# Patient Record
Sex: Male | Born: 1960
Health system: Southern US, Community
[De-identification: ages and names within clinical notes are randomized; demographics above are authoritative.]

## PROBLEM LIST (undated history)

## (undated) DIAGNOSIS — I10 Essential (primary) hypertension: Secondary | ICD-10-CM

## (undated) HISTORY — PX: CHOLECYSTECTOMY: SHX55

## (undated) HISTORY — PX: BACK SURGERY: SHX140

## (undated) HISTORY — PX: GLAUCOMA REPAIR: SHX214

---

## 1998-03-29 ENCOUNTER — Inpatient Hospital Stay (HOSPITAL_COMMUNITY): Admission: RE | Admit: 1998-03-29 | Discharge: 1998-04-02 | Payer: Self-pay | Admitting: Neurosurgery

## 1999-12-06 ENCOUNTER — Encounter: Admission: RE | Admit: 1999-12-06 | Discharge: 1999-12-06 | Payer: Self-pay | Admitting: Neurosurgery

## 1999-12-06 ENCOUNTER — Encounter: Payer: Self-pay | Admitting: Neurosurgery

## 2003-04-12 ENCOUNTER — Ambulatory Visit (HOSPITAL_COMMUNITY): Admission: RE | Admit: 2003-04-12 | Discharge: 2003-04-12 | Payer: Self-pay | Admitting: General Surgery

## 2008-12-06 ENCOUNTER — Ambulatory Visit (HOSPITAL_COMMUNITY): Admission: RE | Admit: 2008-12-06 | Discharge: 2008-12-06 | Payer: Self-pay | Admitting: Specialist

## 2011-03-22 NOTE — H&P (Signed)
   Donald Walker, Donald Walker                             ACCOUNT NO.:  [id number]   MEDICAL RECORD NO.:  [id number]                  PATIENT TYPE:   LOCATION:                                       FACILITY:   PHYSICIAN:  Dalia Heading, M.D.               DATE OF BIRTH:  [birth year]   DATE OF ADMISSION:  DATE OF DISCHARGE:                                HISTORY & PHYSICAL   CHIEF COMPLAINT:  History of adenomatous colon polyp.   HISTORY OF PRESENT ILLNESS:  The patient is a 50 year old white male who is  referred for a follow up colonoscopy.  He had an adenomatous polyp removed 3  years ago at another facility. He is not referred for followup colonoscopy.  He denies any lightheadedness, weight loss, fever, abdominal pain,  constipation, diarrhea, hematochezia, or melena.  He denies any hemorrhoidal  problems.  There is no immediate family history of colon carcinoma.   PAST MEDICAL HISTORY:  Past medical history includes back problems and eye  problems.   PAST SURGICAL HISTORY:  Back surgery.   CURRENT MEDICATIONS:  1. Alphagan.  2. Eye drops.  3. Xalatan.  4. Pilocarpine.   ALLERGIES:  No known drug allergies.   REVIEW OF SYSTEMS:  Noncontributory.   PHYSICAL EXAMINATION:  GENERAL:  On physical examination, the patient is a  well-developed well-nourished white male in no acute distress.  VITAL SIGNS:  He is afebrile and vital signs are stable.  LUNGS:  Clear to auscultation with equal breath sounds bilaterally.  HEART:  Heart examination reveals a regular rate and rhythm without S3, S4,  or murmurs.  ABDOMEN:  The abdomen is soft, nontender, nondistended.  No  hepatosplenomegaly or masses are noted.  RECTAL:  Examination was deferred to the procedure.   IMPRESSION:  History of adenomatous colon polyp.    PLAN:  The patient is scheduled for a colonoscopy on 04/12/2003.  The risks  and benefits of the procedure including bleeding and perforation were fully  explained to the  patient who gave informed consent.                                               Dalia Heading, M.D.    MAJ/MEDQ  D:  03/24/2003  T:  03/24/2003  Job:  [id number]   cc:   Donzetta Sprung  [address]  [place]  [place] [address]  Fax: [phone]

## 2013-08-04 ENCOUNTER — Encounter (INDEPENDENT_AMBULATORY_CARE_PROVIDER_SITE_OTHER): Payer: Self-pay | Admitting: *Deleted

## 2013-08-26 ENCOUNTER — Ambulatory Visit (INDEPENDENT_AMBULATORY_CARE_PROVIDER_SITE_OTHER): Payer: 59 | Admitting: Internal Medicine

## 2013-08-26 ENCOUNTER — Encounter (INDEPENDENT_AMBULATORY_CARE_PROVIDER_SITE_OTHER): Payer: Self-pay | Admitting: Internal Medicine

## 2013-08-26 ENCOUNTER — Telehealth (INDEPENDENT_AMBULATORY_CARE_PROVIDER_SITE_OTHER): Payer: Self-pay | Admitting: *Deleted

## 2013-08-26 ENCOUNTER — Other Ambulatory Visit (INDEPENDENT_AMBULATORY_CARE_PROVIDER_SITE_OTHER): Payer: Self-pay | Admitting: *Deleted

## 2013-08-26 VITALS — BP 118/80 | HR 72 | Temp 98.6°F | Ht 67.5 in | Wt 181.7 lb

## 2013-08-26 DIAGNOSIS — Z800 Family history of malignant neoplasm of digestive organs: Secondary | ICD-10-CM | POA: Insufficient documentation

## 2013-08-26 DIAGNOSIS — Z1211 Encounter for screening for malignant neoplasm of colon: Secondary | ICD-10-CM

## 2013-08-26 DIAGNOSIS — K625 Hemorrhage of anus and rectum: Secondary | ICD-10-CM | POA: Insufficient documentation

## 2013-08-26 DIAGNOSIS — Z860100 Personal history of colon polyps, unspecified: Secondary | ICD-10-CM | POA: Insufficient documentation

## 2013-08-26 NOTE — Progress Notes (Signed)
Subjective:     Patient ID: Donald Walker, male   DOB: [birth year], 52 y.o.   MRN: [id number]  HPI Referred to our office by Dr. Rosann Auerbach for internal hemorrhoids. He tells me that everytime he has a BM he will see blood. This has been occurring for 5 yrs.  He actually saw Dr Samuella Cota in [place] for this same complaint. He has not seen GI since 2011. Hx of colon polyps in 2004. (I will try to get those records)  He will see blood on the tissue. His BMs are not hard. He usually has a BM 2-3 times a day and are normal. No melena.He does have some discomfort when he has a BM in his rectum. Feels like needles. His appetite is good. No weight loss.  Takes Aleve  because of his braces which is infrequently Last colonoscopy in 2011 which he reports was normal. Family hx of colon cancer: paternal grandmother age in her 47s. Review of Systems Current Outpatient Prescriptions  Medication Sig Dispense Refill  . naproxen sodium (ANAPROX) 220 MG tablet Take 220 mg by mouth 2 (two) times daily with a meal.       No current facility-administered medications for this visit.   History reviewed. No pertinent past medical history. Past Surgical History  Procedure Laterality Date  . Cholecystectomy  2004 Dr. Gabriel Cirri    rt upper quadrant pain, gallstones  . Back surgery      1999  . Glaucoma repair     No Known Allergies     Objective:   Physical Exam  Filed Vitals:   08/26/13 1507  BP: 118/80  Pulse: 72  Temp: 98.6 F (37 C)  Height: 5' 7.5" (1.715 m)  Weight: 181 lb 11.2 oz (82.419 kg)  Alert and oriented. Skin warm and dry. Oral mucosa is moist.   . Sclera anicteric, conjunctivae is pink. Thyroid not enlarged. No cervical lymphadenopathy. Lungs clear. Heart regular rate and rhythm.  Abdomen is soft. Bowel sounds are positive. No hepatomegaly. No abdominal masses felt. No tenderness.  No edema to lower extremities. Patient is alert and oriented.     Stool brown and guaiac  negative. Assessment:    Rectal bleeding. Possible hemorrhoidal. Small amt of blood when he wipes. Hx colon polyps an family hx of colon cancer in a paternal grandmother. Colonic neoplasm needs to be ruled out. Colonic polyps is also in the differential.     Plan:    Colonoscopy with Dr. Karilyn Cota.The risks and benefits such as perforation, bleeding, and infection were reviewed with the patient and is agreeable. Apothecary Hemorrhoidal cream Rx given to patient to use 4 times a day as needed.   I will try to locate his colonoscopy reports.

## 2013-08-26 NOTE — Patient Instructions (Signed)
Colonoscopy with Dr. Rehman. The risks and benefits such as perforation, bleeding, and infection were reviewed with the patient and is agreeable. 

## 2013-08-26 NOTE — Telephone Encounter (Signed)
Patient needs movi prep 

## 2013-08-27 MED ORDER — PEG-KCL-NACL-NASULF-NA ASC-C 100 G PO SOLR: 1.0000 | Freq: Once | ORAL | Status: AC

## 2013-09-14 ENCOUNTER — Encounter (INDEPENDENT_AMBULATORY_CARE_PROVIDER_SITE_OTHER): Payer: Self-pay

## 2013-10-14 ENCOUNTER — Encounter (INDEPENDENT_AMBULATORY_CARE_PROVIDER_SITE_OTHER): Payer: Self-pay

## 2013-11-18 ENCOUNTER — Encounter (HOSPITAL_COMMUNITY): Payer: Self-pay | Admitting: Pharmacy Technician

## 2013-12-02 ENCOUNTER — Encounter (HOSPITAL_COMMUNITY): Admission: RE | Payer: Self-pay | Source: Ambulatory Visit

## 2013-12-02 ENCOUNTER — Ambulatory Visit (HOSPITAL_COMMUNITY): Admission: RE | Admit: 2013-12-02 | Payer: 59 | Source: Ambulatory Visit | Admitting: Internal Medicine

## 2013-12-02 SURGERY — COLONOSCOPY
Anesthesia: Moderate Sedation

## 2016-01-23 ENCOUNTER — Other Ambulatory Visit: Payer: Self-pay | Admitting: Gastroenterology

## 2016-01-23 DIAGNOSIS — R1084 Generalized abdominal pain: Secondary | ICD-10-CM

## 2016-01-29 ENCOUNTER — Ambulatory Visit
Admission: RE | Admit: 2016-01-29 | Discharge: 2016-01-29 | Disposition: A | Discharge status: Home / Self Care | Payer: 59 | Source: Ambulatory Visit | Attending: Gastroenterology | Admitting: Gastroenterology

## 2016-01-29 DIAGNOSIS — R1084 Generalized abdominal pain: Secondary | ICD-10-CM

## 2016-01-29 MED ORDER — IOPAMIDOL (ISOVUE-300) INJECTION 61%
100.0000 mL | Freq: Once | INTRAVENOUS | Status: AC | PRN
  Administered 2016-01-29: 100 mL via INTRAVENOUS

## 2016-08-23 LAB — BASIC METABOLIC PANEL: GLUCOSE: 122 mg/dL

## 2016-12-13 DIAGNOSIS — J019 Acute sinusitis, unspecified: Secondary | ICD-10-CM | POA: Diagnosis not present

## 2016-12-13 DIAGNOSIS — R1011 Right upper quadrant pain: Secondary | ICD-10-CM | POA: Diagnosis not present

## 2016-12-20 DIAGNOSIS — R932 Abnormal findings on diagnostic imaging of liver and biliary tract: Secondary | ICD-10-CM | POA: Diagnosis not present

## 2016-12-20 DIAGNOSIS — Z9049 Acquired absence of other specified parts of digestive tract: Secondary | ICD-10-CM | POA: Diagnosis not present

## 2016-12-20 DIAGNOSIS — R109 Unspecified abdominal pain: Secondary | ICD-10-CM | POA: Diagnosis not present

## 2016-12-20 DIAGNOSIS — R1011 Right upper quadrant pain: Secondary | ICD-10-CM | POA: Diagnosis not present

## 2017-01-27 DIAGNOSIS — J019 Acute sinusitis, unspecified: Secondary | ICD-10-CM | POA: Diagnosis not present

## 2017-03-28 DIAGNOSIS — S86811A Strain of other muscle(s) and tendon(s) at lower leg level, right leg, initial encounter: Secondary | ICD-10-CM | POA: Diagnosis not present

## 2017-04-08 DIAGNOSIS — M47812 Spondylosis without myelopathy or radiculopathy, cervical region: Secondary | ICD-10-CM | POA: Diagnosis not present

## 2017-04-08 DIAGNOSIS — R001 Bradycardia, unspecified: Secondary | ICD-10-CM | POA: Diagnosis not present

## 2017-04-08 DIAGNOSIS — I10 Essential (primary) hypertension: Secondary | ICD-10-CM | POA: Diagnosis not present

## 2017-04-08 DIAGNOSIS — M531 Cervicobrachial syndrome: Secondary | ICD-10-CM | POA: Diagnosis not present

## 2017-04-14 DIAGNOSIS — M47812 Spondylosis without myelopathy or radiculopathy, cervical region: Secondary | ICD-10-CM | POA: Diagnosis not present

## 2017-04-14 DIAGNOSIS — M531 Cervicobrachial syndrome: Secondary | ICD-10-CM | POA: Diagnosis not present

## 2017-04-17 DIAGNOSIS — M531 Cervicobrachial syndrome: Secondary | ICD-10-CM | POA: Diagnosis not present

## 2017-04-17 DIAGNOSIS — M47812 Spondylosis without myelopathy or radiculopathy, cervical region: Secondary | ICD-10-CM | POA: Diagnosis not present

## 2017-04-21 DIAGNOSIS — M47812 Spondylosis without myelopathy or radiculopathy, cervical region: Secondary | ICD-10-CM | POA: Diagnosis not present

## 2017-04-21 DIAGNOSIS — M531 Cervicobrachial syndrome: Secondary | ICD-10-CM | POA: Diagnosis not present

## 2017-05-12 ENCOUNTER — Other Ambulatory Visit: Payer: Self-pay | Admitting: Cardiology

## 2017-05-12 ENCOUNTER — Ambulatory Visit
Admission: RE | Admit: 2017-05-12 | Discharge: 2017-05-12 | Disposition: A | Discharge status: Home / Self Care | Payer: 59 | Source: Ambulatory Visit | Attending: Cardiology | Admitting: Cardiology

## 2017-05-12 DIAGNOSIS — I10 Essential (primary) hypertension: Secondary | ICD-10-CM | POA: Diagnosis not present

## 2017-05-12 DIAGNOSIS — R079 Chest pain, unspecified: Secondary | ICD-10-CM

## 2017-05-12 DIAGNOSIS — Z0189 Encounter for other specified special examinations: Secondary | ICD-10-CM | POA: Diagnosis not present

## 2017-05-12 DIAGNOSIS — H5203 Hypermetropia, bilateral: Secondary | ICD-10-CM | POA: Diagnosis not present

## 2017-05-12 DIAGNOSIS — R0789 Other chest pain: Secondary | ICD-10-CM | POA: Diagnosis not present

## 2017-05-14 DIAGNOSIS — I10 Essential (primary) hypertension: Secondary | ICD-10-CM | POA: Diagnosis not present

## 2017-05-14 DIAGNOSIS — R5383 Other fatigue: Secondary | ICD-10-CM | POA: Diagnosis not present

## 2017-05-20 DIAGNOSIS — I10 Essential (primary) hypertension: Secondary | ICD-10-CM | POA: Diagnosis not present

## 2017-05-20 DIAGNOSIS — R0789 Other chest pain: Secondary | ICD-10-CM | POA: Diagnosis not present

## 2017-06-04 DIAGNOSIS — I10 Essential (primary) hypertension: Secondary | ICD-10-CM | POA: Diagnosis not present

## 2017-06-04 DIAGNOSIS — R5381 Other malaise: Secondary | ICD-10-CM | POA: Diagnosis not present

## 2017-06-04 DIAGNOSIS — R0789 Other chest pain: Secondary | ICD-10-CM | POA: Diagnosis not present

## 2017-07-17 ENCOUNTER — Other Ambulatory Visit (HOSPITAL_BASED_OUTPATIENT_CLINIC_OR_DEPARTMENT_OTHER): Payer: Self-pay

## 2017-07-17 DIAGNOSIS — R5383 Other fatigue: Secondary | ICD-10-CM

## 2017-07-17 DIAGNOSIS — G4730 Sleep apnea, unspecified: Secondary | ICD-10-CM

## 2017-07-23 ENCOUNTER — Ambulatory Visit (HOSPITAL_BASED_OUTPATIENT_CLINIC_OR_DEPARTMENT_OTHER): Payer: 59 | Attending: Family Medicine | Admitting: Internal Medicine

## 2017-07-23 VITALS — Ht 67.0 in | Wt 182.0 lb

## 2017-07-23 DIAGNOSIS — G4730 Sleep apnea, unspecified: Secondary | ICD-10-CM

## 2017-07-23 DIAGNOSIS — R5383 Other fatigue: Secondary | ICD-10-CM

## 2017-07-23 DIAGNOSIS — G4733 Obstructive sleep apnea (adult) (pediatric): Secondary | ICD-10-CM | POA: Insufficient documentation

## 2017-07-23 DIAGNOSIS — G4761 Periodic limb movement disorder: Secondary | ICD-10-CM | POA: Diagnosis not present

## 2017-07-26 DIAGNOSIS — G4730 Sleep apnea, unspecified: Secondary | ICD-10-CM

## 2017-07-26 NOTE — Procedures (Signed)
Patient Name: Donald Walker, Donald Walker Date: 07/23/2017 Gender: Male D.O.B: 1960-11-20 Age (years): 56 Referring Provider: Caryl Bis Height (inches): 54 Interpreting Physician: Baird Lyons MD, ABSM Weight (lbs): 182 RPSGT: Carolin Coy BMI: 29 MRN: [id number] Neck Size: 16.00 CLINICAL INFORMATION The patient is referred for a split night study with BPAP. MEDICATIONS Medications self-administered by patient taken the night of the study : none reported  SLEEP STUDY TECHNIQUE As per the AASM Manual for the Scoring of Sleep and Associated Events v2.3 (April 2016) with a hypopnea requiring 4% desaturations.  The channels recorded and monitored were frontal, central and occipital EEG, electrooculogram (EOG), submentalis EMG (chin), nasal and oral airflow, thoracic and abdominal wall motion, anterior tibialis EMG, snore microphone, electrocardiogram, and pulse oximetry. Bi-level positive airway pressure (BiPAP) was initiated when the patient met split night criteria and was titrated according to treat sleep-disordered breathing.  RESPIRATORY PARAMETERS Diagnostic  Total AHI (/hr): 28.9 RDI (/hr): 28.9 OA Index (/hr): 2.4 CA Index (/hr): 0.4 REM AHI (/hr): N/A NREM AHI (/hr): 28.9 Supine AHI (/hr): 31.7 Non-supine AHI (/hr): 11.60 Min O2 Sat (%): 82.00 Mean O2 (%): 91.81 Time below 88% (min): 3.1   Titration  Optimal IPAP Pressure (cm): 18 Optimal EPAP Pressure (cm): 14 AHI at Optimal Pressure (/hr): 0.0 Min O2 at Optimal Pressure (%): 94.0 Sleep % at Optimal (%): 80 Supine % at Optimal (%): 0      SLEEP ARCHITECTURE The study was initiated at 10:07:46 PM and terminated at 5:08:44 AM. The total recorded time was 421.0 minutes. EEG confirmed total sleep time was 322.5 minutes yielding a sleep efficiency of 76.6%. Sleep onset after lights out was 15.2 minutes with a REM latency of 290.0 minutes. The patient spent 13.95% of the night in stage N1 sleep, 78.60% in stage N2 sleep,  0.00% in stage N3 and 7.44% in REM. Wake after sleep onset (WASO) was 83.3 minutes. The Arousal Index was 9.1/hour.  LEG MOVEMENT DATA The total Periodic Limb Movements of Sleep (PLMS) were 145. The PLMS index was 26.98 .  CARDIAC DATA The 2 lead EKG demonstrated sinus rhythm. The mean heart rate was 50.92 beats per minute. Other EKG findings include: None.  IMPRESSIONS - Moderate obstructive sleep apnea occurred during the diagnostic portion of the study (AHI = 28.9 /hour). - CPAP provided inadequate control at comfortable pressure and was changed to BIPAP. - An optimal BIPAP pressure was selected for this patient ( 18 / 14 cm of water) - Mild central sleep apnea occurred during the diagnostic portion of the study (CAI = 0.4/hour). - Mild oxygen desaturation was noted during the diagnostic portion of the study (Min O2 = 82.00%). - The patient snored with Moderate snoring volume during the diagnostic portion of the study. - No cardiac abnormalities were noted during this study. - Severe periodic limb movements of sleep occurred during the study.  DIAGNOSIS - Obstructive Sleep Apnea (327.23 [G47.33 ICD-10]) - Periodic Limb Movement Syndrome  RECOMMENDATIONS - Trial of BiPAP therapy on 18/14 cm H2O with a Medium size [employer] Respironics Nasal Pillow Mask Nuance Pro Gel mask and heated humidification. - Be careful with alcohol, sedatives and other CNS depressants that may worsen sleep apnea and disrupt normal sleep architecture. - Sleep hygiene should be reviewed to assess factors that may improve sleep quality. - Weight management and regular exercise should be initiated or continued. - If limb movement sleep disturbance continues after patient is established with BIPAP, then consider trial of specific therapy  such as Requip or Mirapex.  [Electronically signed] 07/26/2017 12:26 PM  Baird Lyons MD, [place], American Board of Sleep Medicine   NPI: [id number]  [place], American Board of Sleep Medicine  ELECTRONICALLY SIGNED ON:  07/26/2017, 12:22 PM [place] PH: [phone]   FX: [phone] [employer]

## 2017-08-13 DIAGNOSIS — G4733 Obstructive sleep apnea (adult) (pediatric): Secondary | ICD-10-CM | POA: Diagnosis not present

## 2017-08-25 ENCOUNTER — Encounter (HOSPITAL_BASED_OUTPATIENT_CLINIC_OR_DEPARTMENT_OTHER): Payer: 59

## 2017-08-27 DIAGNOSIS — Z0001 Encounter for general adult medical examination with abnormal findings: Secondary | ICD-10-CM | POA: Diagnosis not present

## 2017-09-02 DIAGNOSIS — Z0001 Encounter for general adult medical examination with abnormal findings: Secondary | ICD-10-CM | POA: Diagnosis not present

## 2017-09-13 DIAGNOSIS — G4733 Obstructive sleep apnea (adult) (pediatric): Secondary | ICD-10-CM | POA: Diagnosis not present

## 2017-10-13 DIAGNOSIS — G4733 Obstructive sleep apnea (adult) (pediatric): Secondary | ICD-10-CM | POA: Diagnosis not present

## 2017-11-13 DIAGNOSIS — G4733 Obstructive sleep apnea (adult) (pediatric): Secondary | ICD-10-CM | POA: Diagnosis not present

## 2017-12-14 DIAGNOSIS — G4733 Obstructive sleep apnea (adult) (pediatric): Secondary | ICD-10-CM | POA: Diagnosis not present

## 2017-12-25 DIAGNOSIS — J209 Acute bronchitis, unspecified: Secondary | ICD-10-CM | POA: Diagnosis not present

## 2018-01-11 DIAGNOSIS — G4733 Obstructive sleep apnea (adult) (pediatric): Secondary | ICD-10-CM | POA: Diagnosis not present

## 2018-02-11 DIAGNOSIS — G4733 Obstructive sleep apnea (adult) (pediatric): Secondary | ICD-10-CM | POA: Diagnosis not present

## 2018-03-02 DIAGNOSIS — E782 Mixed hyperlipidemia: Secondary | ICD-10-CM | POA: Diagnosis not present

## 2018-03-02 DIAGNOSIS — N183 Chronic kidney disease, stage 3 (moderate): Secondary | ICD-10-CM | POA: Diagnosis not present

## 2018-03-02 DIAGNOSIS — L2084 Intrinsic (allergic) eczema: Secondary | ICD-10-CM | POA: Diagnosis not present

## 2018-03-13 DIAGNOSIS — G4733 Obstructive sleep apnea (adult) (pediatric): Secondary | ICD-10-CM | POA: Diagnosis not present

## 2018-03-19 ENCOUNTER — Ambulatory Visit: Payer: 59 | Admitting: General Surgery

## 2018-03-26 ENCOUNTER — Ambulatory Visit: Payer: 59 | Admitting: General Surgery

## 2018-03-26 ENCOUNTER — Encounter: Payer: Self-pay | Admitting: General Surgery

## 2018-03-26 ENCOUNTER — Encounter (INDEPENDENT_AMBULATORY_CARE_PROVIDER_SITE_OTHER): Payer: Self-pay

## 2018-03-26 VITALS — BP 161/88 | HR 53 | Temp 99.1°F | Resp 18 | Wt 193.0 lb

## 2018-03-26 DIAGNOSIS — K602 Anal fissure, unspecified: Secondary | ICD-10-CM

## 2018-03-26 MED ORDER — HYDROCORTISONE 1 % EX CREA: TOPICAL_CREAM | CUTANEOUS | 1 refills | Status: AC

## 2018-03-26 NOTE — Progress Notes (Signed)
Donald Walker; [id number]; [birth year]   HPI Patient is a 57 year old white male who was referred to my care by Dr. Quillian Quince for evaluation and treatment of an anal fissure.  Patient states that he has had intermittent rectal discomfort bleeding to his coccyx and blood on the toilet paper when he wipes himself for the past year.  It is sporadic in nature.  He denies any constipation or straining when he moves his bowels.  He sometimes moves his bowels several times a day.  He has tried a cream regimen several months ago which did resolve his symptoms temporarily.  He last had an episode of what on the toilet paper earlier this week.  He has not had any rectal surgery in the past.  He currently has 0 out of 10 pain. History reviewed. No pertinent past medical history.  Past Surgical History:  Procedure Laterality Date  . BACK SURGERY     1999  . CHOLECYSTECTOMY  2004 Dr. Anthony Sar   rt upper quadrant pain, gallstones  . GLAUCOMA REPAIR      History reviewed. No pertinent family history.  Current Outpatient Medications on File Prior to Visit  Medication Sig Dispense Refill  . amLODipine-valsartan (EXFORGE) 10-160 MG tablet Take 1 tablet by mouth daily.    . Escitalopram Oxalate (LEXAPRO PO) Take by mouth.    . montelukast (SINGULAIR) 10 MG tablet Take 10 mg by mouth at bedtime.    . naproxen sodium (ANAPROX) 220 MG tablet Take 220 mg by mouth 2 (two) times daily with a meal.    . peg 3350 powder (MOVIPREP) 100 G SOLR Take 1 kit (200 g total) by mouth once. (Patient not taking: Reported on 03/26/2018) 1 kit 0   No current facility-administered medications on file prior to visit.     No Known Allergies  Social History   Substance and Sexual Activity  Alcohol Use Yes   Comment: occasionally    Social History   Tobacco Use  Smoking Status Never Smoker  Smokeless Tobacco Never Used    Review of Systems  Constitutional: Positive for malaise/fatigue.  HENT: Positive for sinus pain.    Eyes: Negative.   Respiratory: Negative.   Cardiovascular: Negative.   Gastrointestinal: Negative.   Genitourinary: Negative.   Musculoskeletal: Positive for joint pain and neck pain.  Skin: Positive for rash.  Endo/Heme/Allergies: Negative.   Psychiatric/Behavioral: Negative.     Objective   Vitals:   03/26/18 0918  BP: (!) 161/88  Pulse: (!) 53  Resp: 18  Temp: 99.1 F (37.3 C)    Physical Exam  Constitutional: He appears well-developed and well-nourished.  HENT:  Head: Normocephalic and atraumatic.  Cardiovascular: Normal rate, regular rhythm and normal heart sounds. Exam reveals no gallop and no friction rub.  No murmur heard. Pulmonary/Chest: Effort normal and breath sounds normal. No stridor. No respiratory distress. He has no wheezes. He has no rales.  Genitourinary:  Genitourinary Comments: Very small posterior anal fissure with sentinel tag present.  Sphincter tone is somewhat tight, but does relax.  No strictures noted.  No external hemorrhoidal tissue present.  No mass in the rectal vault.  No blood present.  Vitals reviewed.   Assessment  Anal fissure Plan   I told the patient that I would reserve surgical intervention once his symptoms are not tolerable.  We will try a course of hydrocortisone cream as needed when he has a flareup.  He understands this and agrees.  Literature was given.  Follow-up  as needed.

## 2018-03-26 NOTE — Patient Instructions (Signed)
Anal Fissure, Adult An anal fissure is a small tear or crack in the skin around the anus. Bleeding from a fissure usually stops on its own within a few minutes. However, bleeding will often occur again with each bowel movement until the crack heals. What are the causes? This condition may be caused by:  Passing large, hard stool (feces).  Frequent diarrhea.  Constipation.  Inflammatory bowel disease (Crohn disease or ulcerative colitis).  Infections.  Anal sex.  What are the signs or symptoms? Symptoms of this condition include:  Bleeding from the rectum.  Small amounts of blood seen on your stool, on toilet paper, or in the toilet after a bowel movement.  Painful bowel movements.  Itching or irritation around the anus.  How is this diagnosed? A health care provider may diagnose this condition by closely examining the anal area. An anal fissure can usually be seen with careful inspection. In some cases, a rectal exam may be performed, or a short tube (anoscope) may be used to examine the anal canal. How is this treated? Treatment for this condition may include:  Taking steps to avoid constipation. This may include making changes to your diet, such as increasing your intake of fiber or fluid.  Taking fiber supplements. These supplements can soften your stool to help make bowel movements easier. Your health care provider may also prescribe a stool softener if your stool is often hard.  Taking sitz baths. This may help to heal the tear.  Using medicated creams or ointments. These may be prescribed to lessen discomfort.  Follow these instructions at home: Eating and drinking  Avoid foods that may be constipating, such as bananas and dairy products.  Drink enough fluid to keep your urine clear or pale yellow.  Maintain a diet that is high in fruits, whole grains, and vegetables. General instructions  Keep the anal area as clean and dry as possible.  Take sitz baths as  told by your health care provider. Do not use soap in the sitz baths.  Take over-the-counter and prescription medicines only as told by your health care provider.  Use creams or ointments only as told by your health care provider.  Keep all follow-up visits as told by your health care provider. This is important. Contact a health care provider if:  You have more bleeding.  You have a fever.  You have diarrhea that is mixed with blood.  You continue to have pain.  Your problem is getting worse rather than better. This information is not intended to replace advice given to you by your health care provider. Make sure you discuss any questions you have with your health care provider. Document Released: 10/21/2005 Document Revised: 02/28/2016 Document Reviewed: 01/16/2015 Elsevier Interactive Patient Education  2018 Elsevier Inc.  

## 2018-04-13 DIAGNOSIS — G4733 Obstructive sleep apnea (adult) (pediatric): Secondary | ICD-10-CM | POA: Diagnosis not present

## 2018-04-24 DIAGNOSIS — T1501XA Foreign body in cornea, right eye, initial encounter: Secondary | ICD-10-CM | POA: Diagnosis not present

## 2018-04-27 DIAGNOSIS — T1501XA Foreign body in cornea, right eye, initial encounter: Secondary | ICD-10-CM | POA: Diagnosis not present

## 2018-05-11 DIAGNOSIS — S30861A Insect bite (nonvenomous) of abdominal wall, initial encounter: Secondary | ICD-10-CM | POA: Diagnosis not present

## 2018-05-11 DIAGNOSIS — H109 Unspecified conjunctivitis: Secondary | ICD-10-CM | POA: Diagnosis not present

## 2018-05-13 DIAGNOSIS — G4733 Obstructive sleep apnea (adult) (pediatric): Secondary | ICD-10-CM | POA: Diagnosis not present

## 2018-06-13 DIAGNOSIS — G4733 Obstructive sleep apnea (adult) (pediatric): Secondary | ICD-10-CM | POA: Diagnosis not present

## 2018-07-14 DIAGNOSIS — G4733 Obstructive sleep apnea (adult) (pediatric): Secondary | ICD-10-CM | POA: Diagnosis not present

## 2018-08-13 DIAGNOSIS — G4733 Obstructive sleep apnea (adult) (pediatric): Secondary | ICD-10-CM | POA: Diagnosis not present

## 2018-09-02 DIAGNOSIS — Z0001 Encounter for general adult medical examination with abnormal findings: Secondary | ICD-10-CM | POA: Diagnosis not present

## 2018-09-07 DIAGNOSIS — Z0001 Encounter for general adult medical examination with abnormal findings: Secondary | ICD-10-CM | POA: Diagnosis not present

## 2018-09-07 DIAGNOSIS — E782 Mixed hyperlipidemia: Secondary | ICD-10-CM | POA: Diagnosis not present

## 2018-09-07 DIAGNOSIS — L2084 Intrinsic (allergic) eczema: Secondary | ICD-10-CM | POA: Diagnosis not present

## 2018-09-07 DIAGNOSIS — N183 Chronic kidney disease, stage 3 (moderate): Secondary | ICD-10-CM | POA: Diagnosis not present

## 2018-09-07 DIAGNOSIS — Z1212 Encounter for screening for malignant neoplasm of rectum: Secondary | ICD-10-CM | POA: Diagnosis not present

## 2018-09-09 DIAGNOSIS — G4733 Obstructive sleep apnea (adult) (pediatric): Secondary | ICD-10-CM | POA: Diagnosis not present

## 2018-09-13 DIAGNOSIS — G4733 Obstructive sleep apnea (adult) (pediatric): Secondary | ICD-10-CM | POA: Diagnosis not present

## 2018-10-05 DIAGNOSIS — D229 Melanocytic nevi, unspecified: Secondary | ICD-10-CM | POA: Diagnosis not present

## 2019-02-04 IMAGING — DX DG CHEST 2V
2 series · 2 of 2 positions shown · non-contrast
Comparison: Report of plain films the chest and CT chest 10/27/2015
reviewed. Images are not available.

CLINICAL DATA: Chest pain for 2 weeks.  No known injury.

EXAM:
CHEST  2 VIEW

[dg chest 2 view (1 of 2)]
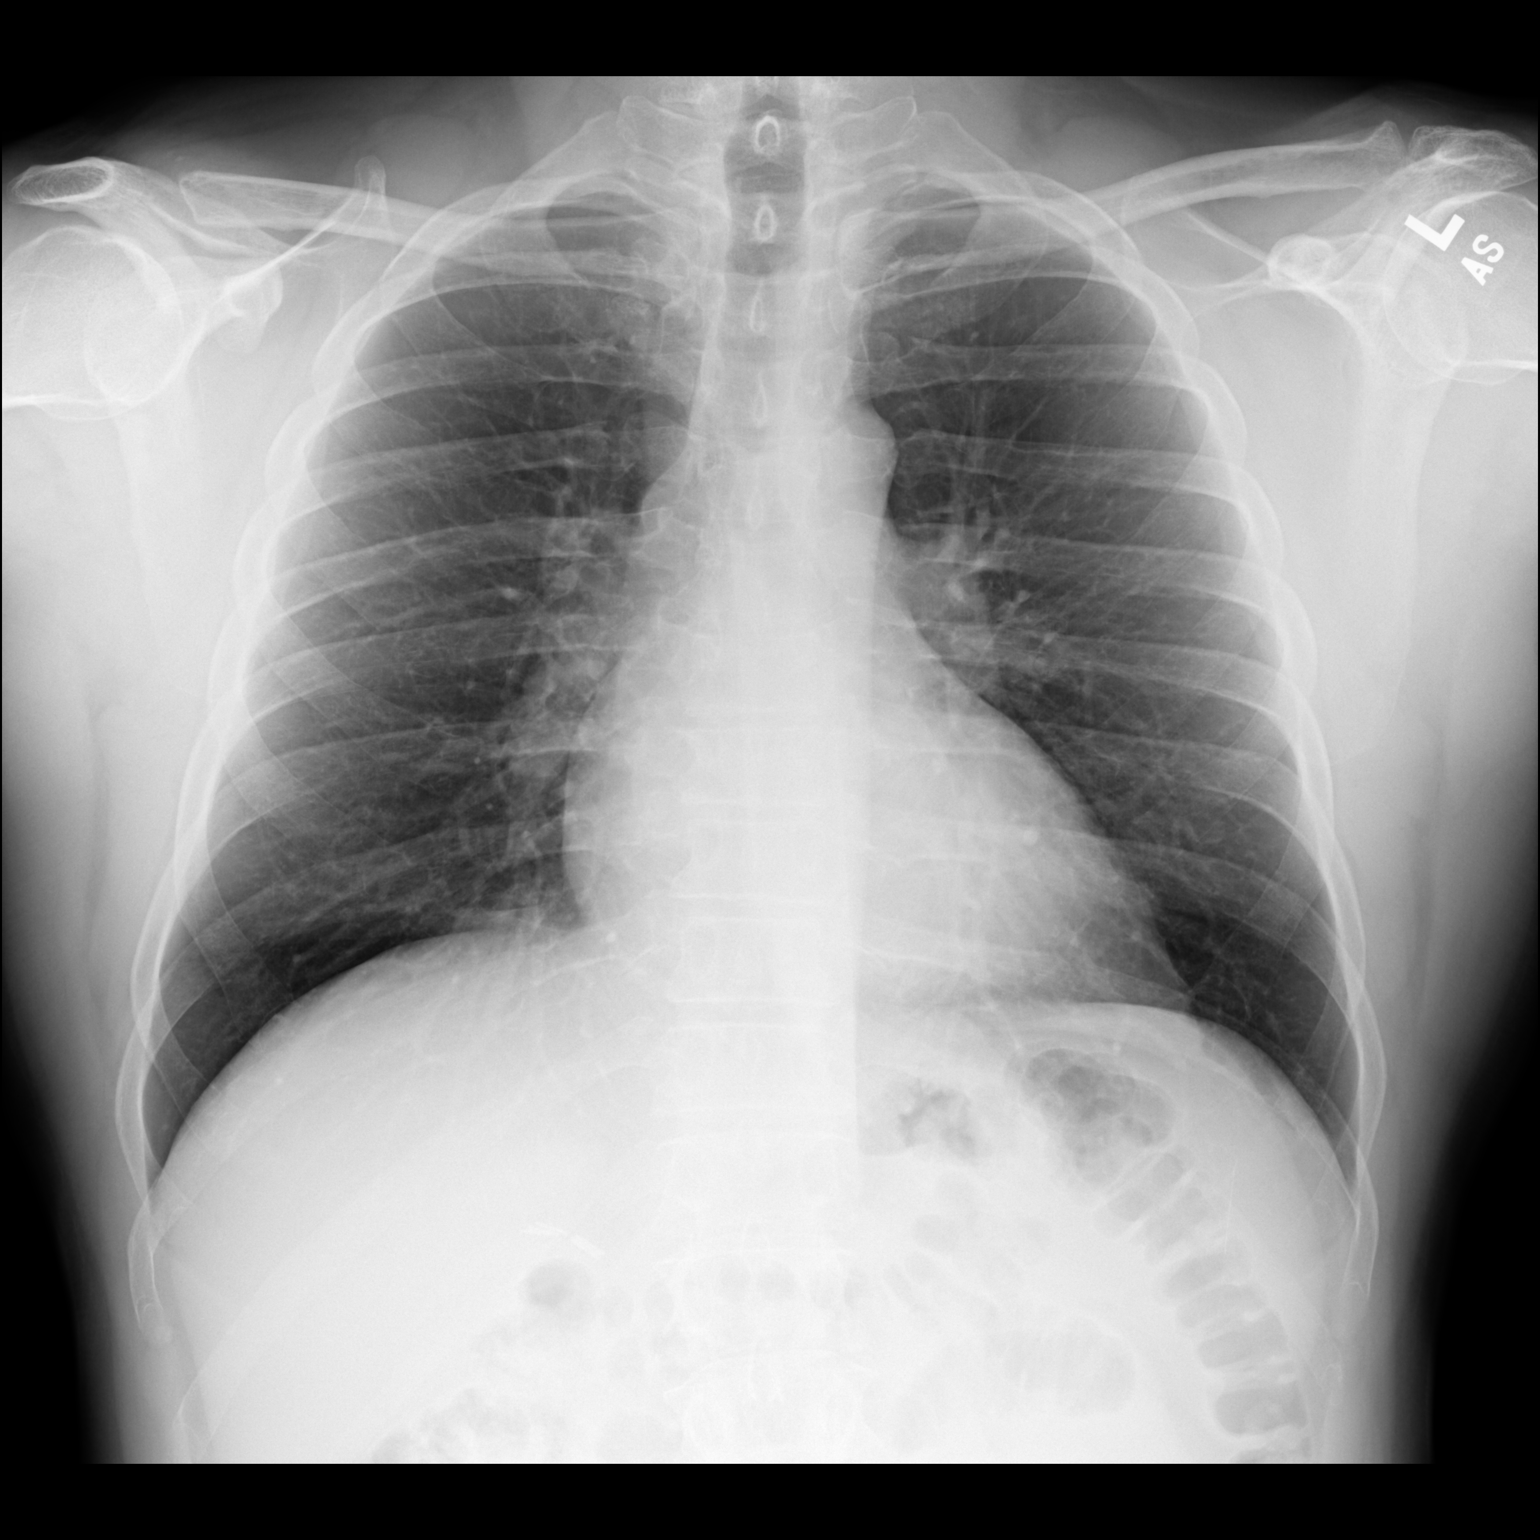

[dg chest 2 view (2 of 2)]
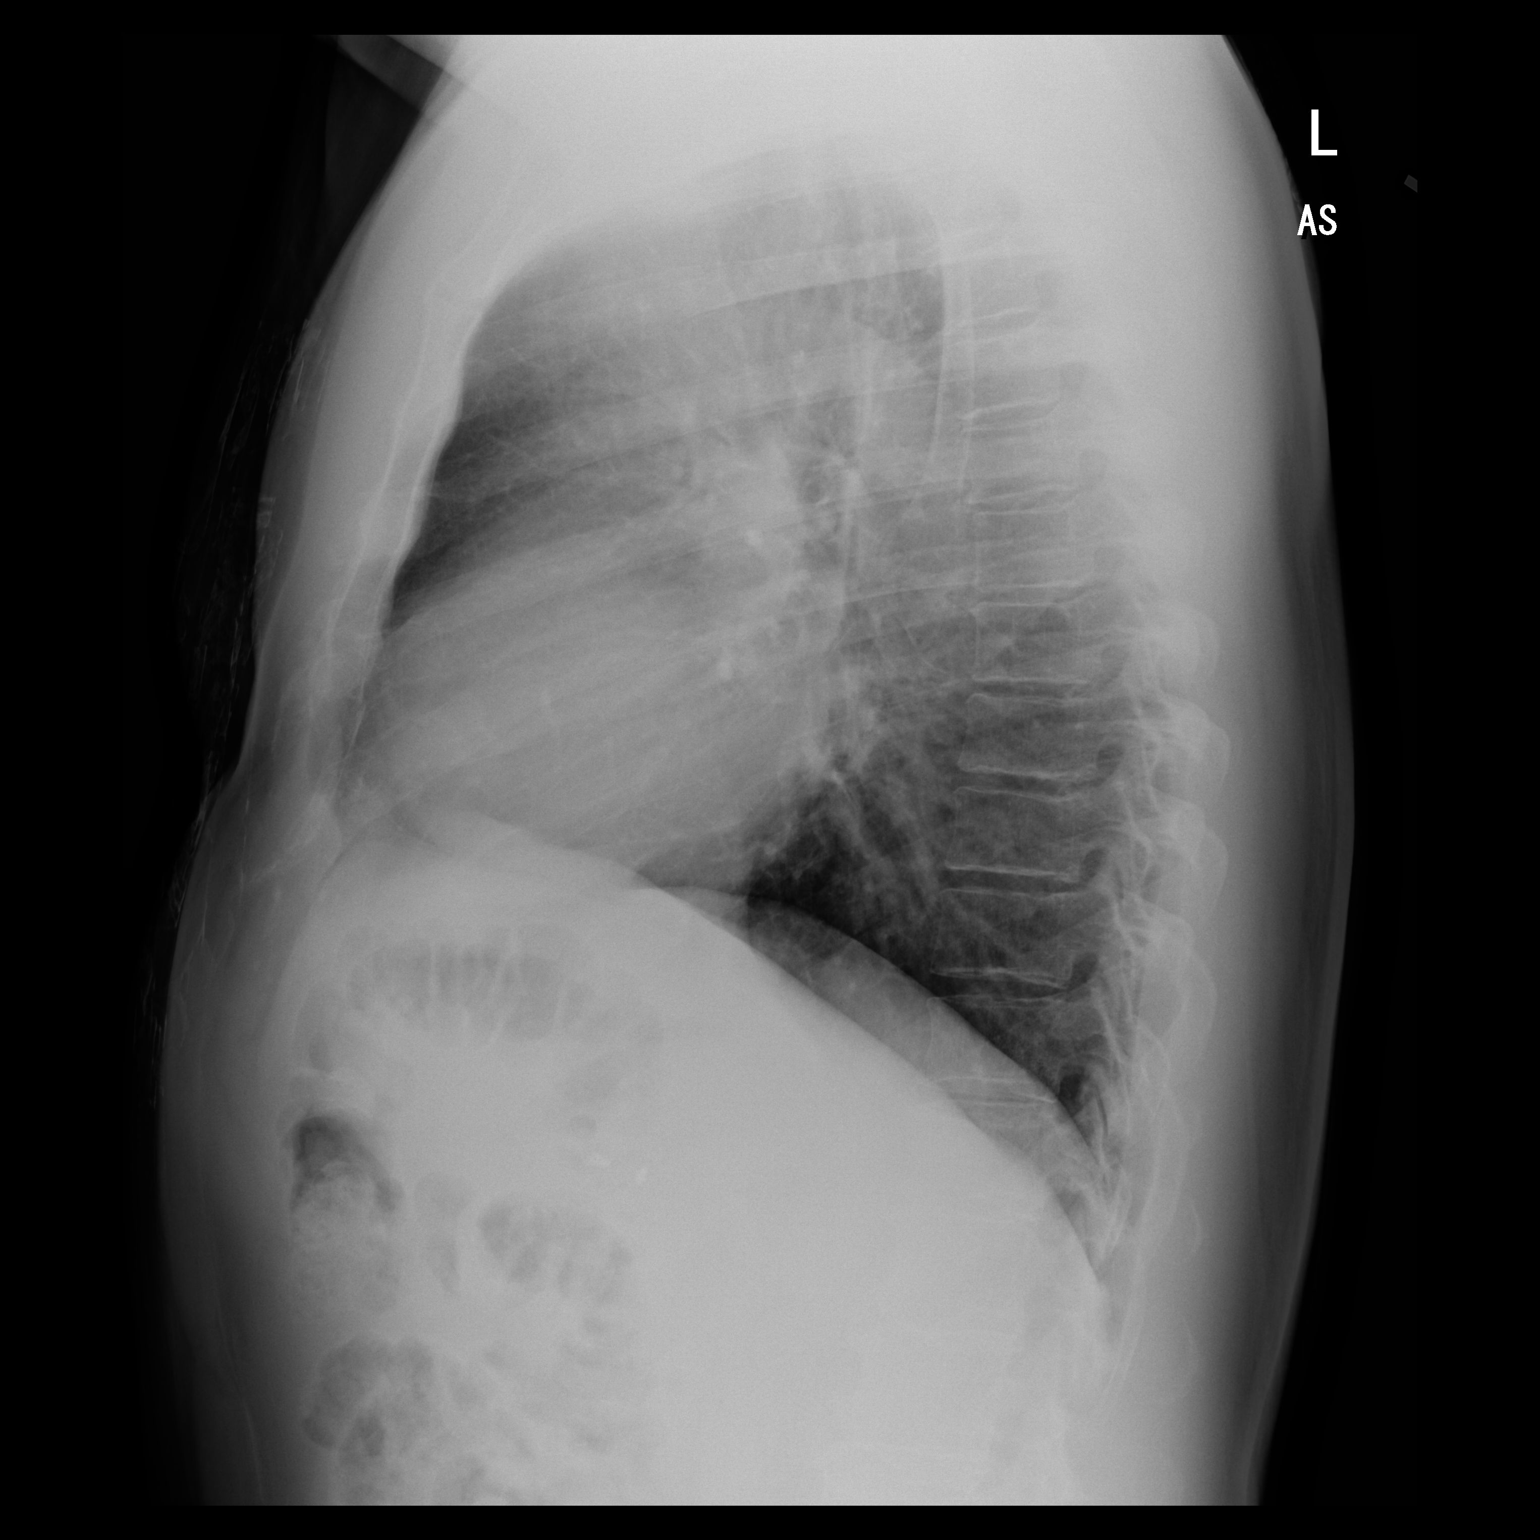

[2 of 2 positions shown; findings below may reference images not displayed]

FINDINGS: Lungs are clear. Heart size is normal. No pneumothorax pleural
effusion. No acute bony abnormality. The patient is status post
resection of the distal right clavicle.
IMPRESSION: No acute disease.

## 2020-07-19 ENCOUNTER — Other Ambulatory Visit: Payer: Self-pay

## 2020-07-19 ENCOUNTER — Encounter (HOSPITAL_COMMUNITY): Payer: Self-pay

## 2020-07-19 ENCOUNTER — Emergency Department (HOSPITAL_COMMUNITY)
Admission: EM | Admit: 2020-07-19 | Discharge: 2020-07-19 | Disposition: A | Discharge status: Home / Self Care | Payer: 59 | Attending: Emergency Medicine | Admitting: Emergency Medicine

## 2020-07-19 ENCOUNTER — Emergency Department (HOSPITAL_COMMUNITY): Payer: 59

## 2020-07-19 DIAGNOSIS — R066 Hiccough: Secondary | ICD-10-CM | POA: Insufficient documentation

## 2020-07-19 DIAGNOSIS — I10 Essential (primary) hypertension: Secondary | ICD-10-CM | POA: Diagnosis not present

## 2020-07-19 HISTORY — DX: Essential (primary) hypertension: I10

## 2020-07-19 MED ORDER — CHLORPROMAZINE HCL 25 MG PO TABS
25.0000 mg | ORAL_TABLET | Freq: Three times a day (TID) | ORAL | 0 refills | Status: AC | PRN
Start: 2020-07-19 — End: ?

## 2020-07-19 MED ORDER — CHLORPROMAZINE HCL 25 MG PO TABS
25.0000 mg | ORAL_TABLET | Freq: Once | ORAL | Status: AC
  Administered 2020-07-19: 25 mg via ORAL
  Filled 2020-07-19: qty 1

## 2020-07-19 NOTE — ED Provider Notes (Signed)
Emergency Department Provider Note   I have reviewed the triage vital signs and the nursing notes.   HISTORY  Chief Complaint Hiccups   HPI Donald Walker is a 59 y.o. male w/ PMH reviewed below presents to the emergency department with intractable hiccups over the past week.  Patient states his symptoms began while on vacation when he was seasoning a grill.  He tells me that he breathed in some smoke and fumes and had some forceful coughing.  His hiccups began shortly after that.  He has been managing with multiple home remedies without relief.  He is having some pain with hiccuping but not in between in his chest.  Denies shortness of breath.  He went to urgent care was treated with PPI and Maalox type medicines with no relief so returns to the emergency department for evaluation.  No radiation of symptoms or other modifying factors.  Past Medical History:  Diagnosis Date   Hypertension     Patient Active Problem List   Diagnosis Date Noted   Rectal bleeding 08/26/2013   Family hx of colon cancer 08/26/2013   Personal history of colonic polyps 08/26/2013    Past Surgical History:  Procedure Laterality Date   BACK SURGERY     1999   CHOLECYSTECTOMY  2004 Dr. Anthony Sar   rt upper quadrant pain, gallstones   GLAUCOMA REPAIR      Allergies Patient has no known allergies.  History reviewed. No pertinent family history.  Social History Social History   Tobacco Use   Smoking status: Never Smoker   Smokeless tobacco: Never Used  Substance Use Topics   Alcohol use: Yes    Comment: occasionally   Drug use: No    Review of Systems   Constitutional: No fever/chills Eyes: No visual changes. ENT: No sore throat. Cardiovascular: Denies chest pain. Respiratory: Denies shortness of breath. Gastrointestinal: No abdominal pain.  No nausea, no vomiting.  No diarrhea.  No constipation. Positive intractable hiccups.  Genitourinary: Negative for  dysuria. Musculoskeletal: Negative for back pain. Skin: Negative for rash. Neurological: Negative for headaches, focal weakness or numbness.  10-point ROS otherwise negative.  ____________________________________________   PHYSICAL EXAM:  VITAL SIGNS: ED Triage Vitals [07/19/20 1609]  Enc Vitals Group     BP (!) 159/104     Pulse Rate 60     Resp 20     Temp 98.6 F (37 C)     Temp Source Oral     SpO2 97 %     Weight 181 lb (82.1 kg)     Height 5' 7.5" (1.715 m)   Constitutional: Alert and oriented. Well appearing and in no acute distress. Eyes: Conjunctivae are normal.  Head: Atraumatic. Nose: No congestion/rhinnorhea. Mouth/Throat: Mucous membranes are moist.   Neck: No stridor.   Cardiovascular: Normal rate, regular rhythm. Good peripheral circulation. Grossly normal heart sounds.   Respiratory: Normal respiratory effort.  No retractions. Lungs CTAB. Gastrointestinal: Soft and nontender. No distention.  Musculoskeletal: No gross deformities of extremities. Neurologic:  Normal speech and language. Skin:  Skin is warm, dry and intact. No rash noted.  ____________________________________________  [id number]  DG Chest 2 View  Result Date: 07/19/2020 CLINICAL DATA:  Sore throat EXAM: CHEST - 2 VIEW COMPARISON:  May 12, 2017 FINDINGS: The heart size and mediastinal contours are within normal limits. Both lungs are clear. The visualized skeletal structures are unremarkable. IMPRESSION: No active cardiopulmonary disease. Electronically Signed   By: Ebony Cargo.D.  On: 07/19/2020 22:40    ____________________________________________   PROCEDURES  Procedure(s) performed:   Procedures  None  ____________________________________________   INITIAL IMPRESSION / ASSESSMENT AND PLAN / ED COURSE  Pertinent labs & imaging results that were available during my care of the patient were reviewed by me and considered in my medical decision making (see chart for  details).   Patient presents to the emergency department with intractable hiccups.  Chest x-ray shows no obvious masses or other abnormalities after review.  Plan for discharge with prescription for Thorazine.  Patient has follow-up scheduled with his PCP this coming week. Discussed ED return precautions.    ____________________________________________  FINAL CLINICAL IMPRESSION(S) / ED DIAGNOSES  Final diagnoses:  Hiccups     MEDICATIONS GIVEN DURING THIS VISIT:  Medications  chlorproMAZINE (THORAZINE) tablet 25 mg (25 mg Oral Given 07/19/20 2212)     NEW OUTPATIENT MEDICATIONS STARTED DURING THIS VISIT:  Discharge Medication List as of 07/19/2020 10:45 PM    START taking these medications   Details  chlorproMAZINE (THORAZINE) 25 MG tablet Take 1 tablet (25 mg total) by mouth 3 (three) times daily as needed for hiccoughs., Starting Wed 07/19/2020, Normal        Note:  This document was prepared using Dragon voice recognition software and may include unintentional dictation errors.  Nanda Quinton, MD, [facility]    Shanetha Bradham, [facility], MD 07/22/20 [phone]

## 2020-07-19 NOTE — Discharge Instructions (Signed)
You were seen in the emergency room today with hiccups.  I am starting a new medication which you take as needed for hiccups.  This may cause some drowsiness and you should only take it as needed.  Please call your primary care doctor first thing tomorrow morning to schedule a follow-up appointment.  Return to the emergency department any new or suddenly worsening symptoms.

## 2020-07-19 NOTE — ED Triage Notes (Signed)
Pt to er, pt states that he is here for hiccups for the past 6 days, states that he went to urgent care and they gave him some medicine but it hasn't helped, pt states that it is hard to sleep with the hiccups and he is getting a sore throat.

## 2022-10-03 ENCOUNTER — Other Ambulatory Visit (HOSPITAL_COMMUNITY): Payer: Self-pay | Admitting: Internal Medicine

## 2022-10-03 DIAGNOSIS — M25511 Pain in right shoulder: Secondary | ICD-10-CM

## 2022-10-23 ENCOUNTER — Ambulatory Visit (HOSPITAL_COMMUNITY)
Admission: RE | Admit: 2022-10-23 | Discharge: 2022-10-23 | Disposition: A | Discharge status: Home / Self Care | Payer: No Typology Code available for payment source | Source: Ambulatory Visit | Attending: Internal Medicine | Admitting: Internal Medicine

## 2022-10-23 DIAGNOSIS — M25511 Pain in right shoulder: Secondary | ICD-10-CM | POA: Insufficient documentation

## 2024-02-23 ENCOUNTER — Other Ambulatory Visit (HOSPITAL_COMMUNITY): Payer: Self-pay | Admitting: Orthopedic Surgery

## 2024-02-23 DIAGNOSIS — M25561 Pain in right knee: Secondary | ICD-10-CM

## 2024-02-28 ENCOUNTER — Ambulatory Visit (HOSPITAL_COMMUNITY)
Admission: RE | Admit: 2024-02-28 | Discharge: 2024-02-28 | Disposition: A | Discharge status: Home / Self Care | Source: Ambulatory Visit | Attending: Orthopedic Surgery | Admitting: Orthopedic Surgery

## 2024-02-28 DIAGNOSIS — M25561 Pain in right knee: Secondary | ICD-10-CM | POA: Diagnosis present

## 2024-03-01 DIAGNOSIS — E7849 Other hyperlipidemia: Secondary | ICD-10-CM | POA: Diagnosis not present

## 2024-03-01 DIAGNOSIS — D559 Anemia due to enzyme disorder, unspecified: Secondary | ICD-10-CM | POA: Diagnosis not present

## 2024-03-01 DIAGNOSIS — Z1329 Encounter for screening for other suspected endocrine disorder: Secondary | ICD-10-CM | POA: Diagnosis not present

## 2024-03-01 DIAGNOSIS — E782 Mixed hyperlipidemia: Secondary | ICD-10-CM | POA: Diagnosis not present

## 2024-03-01 DIAGNOSIS — I10 Essential (primary) hypertension: Secondary | ICD-10-CM | POA: Diagnosis not present

## 2024-03-01 DIAGNOSIS — R5383 Other fatigue: Secondary | ICD-10-CM | POA: Diagnosis not present

## 2024-03-01 DIAGNOSIS — Z125 Encounter for screening for malignant neoplasm of prostate: Secondary | ICD-10-CM | POA: Diagnosis not present

## 2024-03-09 DIAGNOSIS — E7849 Other hyperlipidemia: Secondary | ICD-10-CM | POA: Diagnosis not present

## 2024-03-09 DIAGNOSIS — I10 Essential (primary) hypertension: Secondary | ICD-10-CM | POA: Diagnosis not present

## 2024-03-09 DIAGNOSIS — Z0001 Encounter for general adult medical examination with abnormal findings: Secondary | ICD-10-CM | POA: Diagnosis not present

## 2024-03-09 DIAGNOSIS — E782 Mixed hyperlipidemia: Secondary | ICD-10-CM | POA: Diagnosis not present

## 2024-03-09 DIAGNOSIS — Z6833 Body mass index (BMI) 33.0-33.9, adult: Secondary | ICD-10-CM | POA: Diagnosis not present

## 2024-03-09 DIAGNOSIS — G4733 Obstructive sleep apnea (adult) (pediatric): Secondary | ICD-10-CM | POA: Diagnosis not present

## 2024-03-09 DIAGNOSIS — F331 Major depressive disorder, recurrent, moderate: Secondary | ICD-10-CM | POA: Diagnosis not present

## 2024-04-01 ENCOUNTER — Other Ambulatory Visit (HOSPITAL_COMMUNITY): Payer: Self-pay

## 2024-04-01 DIAGNOSIS — Z136 Encounter for screening for cardiovascular disorders: Secondary | ICD-10-CM

## 2024-04-15 ENCOUNTER — Ambulatory Visit (HOSPITAL_COMMUNITY)
Admission: RE | Admit: 2024-04-15 | Discharge: 2024-04-15 | Disposition: A | Discharge status: Home / Self Care | Source: Ambulatory Visit

## 2024-04-15 DIAGNOSIS — Z136 Encounter for screening for cardiovascular disorders: Secondary | ICD-10-CM | POA: Insufficient documentation

## 2024-07-26 DIAGNOSIS — R1084 Generalized abdominal pain: Secondary | ICD-10-CM | POA: Diagnosis not present

## 2024-07-26 DIAGNOSIS — L209 Atopic dermatitis, unspecified: Secondary | ICD-10-CM | POA: Diagnosis not present

## 2024-07-26 DIAGNOSIS — Z6833 Body mass index (BMI) 33.0-33.9, adult: Secondary | ICD-10-CM | POA: Diagnosis not present

## 2024-07-28 DIAGNOSIS — R1084 Generalized abdominal pain: Secondary | ICD-10-CM | POA: Diagnosis not present
